# Patient Record
Sex: Female | Born: 1965 | Race: White | Hispanic: No | Marital: Married | Smoking: Never smoker
Health system: Southern US, Community
[De-identification: ages and names within clinical notes are randomized; demographics above are authoritative.]

## PROBLEM LIST (undated history)

## (undated) DIAGNOSIS — F329 Major depressive disorder, single episode, unspecified: Secondary | ICD-10-CM

## (undated) DIAGNOSIS — R011 Cardiac murmur, unspecified: Secondary | ICD-10-CM

## (undated) DIAGNOSIS — B019 Varicella without complication: Secondary | ICD-10-CM

## (undated) HISTORY — PX: BREAST EXCISIONAL BIOPSY: SUR124

## (undated) HISTORY — DX: Varicella without complication: B01.9

## (undated) HISTORY — PX: TUBAL LIGATION: SHX77

## (undated) HISTORY — DX: Major depressive disorder, single episode, unspecified: F32.9

## (undated) HISTORY — DX: Cardiac murmur, unspecified: R01.1

---

## 2002-06-28 HISTORY — PX: OVARIAN CYST REMOVAL: SHX89

## 2005-06-28 DIAGNOSIS — F32A Depression, unspecified: Secondary | ICD-10-CM

## 2005-06-28 HISTORY — DX: Depression, unspecified: F32.A

## 2013-06-28 DIAGNOSIS — R011 Cardiac murmur, unspecified: Secondary | ICD-10-CM

## 2013-06-28 HISTORY — DX: Cardiac murmur, unspecified: R01.1

## 2015-05-09 LAB — HM MAMMOGRAPHY

## 2015-07-04 LAB — HM COLONOSCOPY

## 2015-07-15 LAB — HM PAP SMEAR: HM Pap smear: NORMAL

## 2016-06-08 ENCOUNTER — Other Ambulatory Visit (INDEPENDENT_AMBULATORY_CARE_PROVIDER_SITE_OTHER): Payer: PRIVATE HEALTH INSURANCE

## 2016-06-08 ENCOUNTER — Ambulatory Visit (INDEPENDENT_AMBULATORY_CARE_PROVIDER_SITE_OTHER): Payer: PRIVATE HEALTH INSURANCE | Admitting: Nurse Practitioner

## 2016-06-08 ENCOUNTER — Encounter: Payer: Self-pay | Admitting: Nurse Practitioner

## 2016-06-08 VITALS — BP 140/86 | HR 70 | Temp 98.2°F | Ht 65.0 in | Wt 188.0 lb

## 2016-06-08 DIAGNOSIS — R030 Elevated blood-pressure reading, without diagnosis of hypertension: Secondary | ICD-10-CM | POA: Insufficient documentation

## 2016-06-08 DIAGNOSIS — Z0000 Encounter for general adult medical examination without abnormal findings: Secondary | ICD-10-CM | POA: Diagnosis not present

## 2016-06-08 DIAGNOSIS — F419 Anxiety disorder, unspecified: Secondary | ICD-10-CM | POA: Insufficient documentation

## 2016-06-08 LAB — CBC WITH DIFFERENTIAL/PLATELET
BASOS PCT: 0.8 % (ref 0.0–3.0)
Basophils Absolute: 0.1 10*3/uL (ref 0.0–0.1)
EOS PCT: 2.6 % (ref 0.0–5.0)
Eosinophils Absolute: 0.2 10*3/uL (ref 0.0–0.7)
HCT: 42.3 % (ref 36.0–46.0)
HEMOGLOBIN: 14.5 g/dL (ref 12.0–15.0)
LYMPHS ABS: 1.7 10*3/uL (ref 0.7–4.0)
Lymphocytes Relative: 22.1 % (ref 12.0–46.0)
MCHC: 34.3 g/dL (ref 30.0–36.0)
MCV: 87.3 fl (ref 78.0–100.0)
MONO ABS: 0.6 10*3/uL (ref 0.1–1.0)
MONOS PCT: 7.7 % (ref 3.0–12.0)
NEUTROS PCT: 66.8 % (ref 43.0–77.0)
Neutro Abs: 5.2 10*3/uL (ref 1.4–7.7)
Platelets: 217 10*3/uL (ref 150.0–400.0)
RBC: 4.85 Mil/uL (ref 3.87–5.11)
RDW: 14.2 % (ref 11.5–15.5)
WBC: 7.8 10*3/uL (ref 4.0–10.5)

## 2016-06-08 LAB — COMPREHENSIVE METABOLIC PANEL
ALBUMIN: 4.5 g/dL (ref 3.5–5.2)
ALK PHOS: 85 U/L (ref 39–117)
ALT: 14 U/L (ref 0–35)
AST: 16 U/L (ref 0–37)
BUN: 11 mg/dL (ref 6–23)
CHLORIDE: 104 meq/L (ref 96–112)
CO2: 29 mEq/L (ref 19–32)
Calcium: 9.6 mg/dL (ref 8.4–10.5)
Creatinine, Ser: 0.61 mg/dL (ref 0.40–1.20)
GFR: 109.92 mL/min (ref >60.00)
Glucose, Bld: 88 mg/dL (ref 70–99)
POTASSIUM: 4.4 meq/L (ref 3.5–5.1)
SODIUM: 141 meq/L (ref 135–145)
TOTAL PROTEIN: 7.5 g/dL (ref 6.0–8.3)
Total Bilirubin: 0.5 mg/dL (ref 0.2–1.2)

## 2016-06-08 LAB — LIPID PANEL
Cholesterol: 187 mg/dL (ref 0–200)
HDL: 59.3 mg/dL (ref >39.00)
LDL Cholesterol: 113 mg/dL — ABNORMAL HIGH (ref 0–99)
NONHDL: 127.33
TRIGLYCERIDES: 73 mg/dL (ref 0.0–149.0)
Total CHOL/HDL Ratio: 3
VLDL: 14.6 mg/dL (ref 0.0–40.0)

## 2016-06-08 LAB — TSH: TSH: 1.7 u[IU]/mL (ref 0.35–4.50)

## 2016-06-08 MED ORDER — SERTRALINE HCL 50 MG PO TABS: 50.0000 mg | ORAL_TABLET | Freq: Every day | ORAL | 1 refills | Status: DC

## 2016-06-08 MED ORDER — SERTRALINE HCL 50 MG PO TABS: 50.0000 mg | ORAL_TABLET | Freq: Every day | ORAL | 5 refills | Status: DC

## 2016-06-08 NOTE — Progress Notes (Signed)
Subjective:    Patient ID: Brittney Hayes, female    DOB: Jun 03, 1966, 50 y.o.   MRN: [id number]  Patient presents today for complete physical or establish care (new patient).  HPI  Moved from [place] to [facility] 01/2016.  Denies any acute complains today  Immunizations: (TDAP, Hep C screen, Pneumovax, Influenza, zoster)  Health Maintenance  Topic Date Due  . Tetanus Vaccine  06/23/1984  . Pap Smear  06/23/1986  . Mammogram  06/24/2015  . Colon Cancer Screening  06/24/2015  . HIV Screening  06/08/2017*  . Flu Shot  Addressed  *Topic was postponed. The date shown is not the original due date.   Diet:regular Weight:  Wt Readings from Last 3 Encounters:  06/08/16 188 lb (85.3 kg)    Exercise:bike Fall Risk: Fall Risk  06/08/2016  Falls in the past year? No   Home Safety:home with daughter and husband Depression/Suicide: Depression screen Brittney Hayes 2/9 06/08/2016  Decreased Interest 0  Down, Depressed, Hopeless 0  PHQ - 2 Score 0   No flowsheet data found. Colonoscopy (every 5-68yr, >50-7551yr:2016 (normal per patient), record requested. Pap Smear (every 3y74yror >21-29 without HPV, every 51yr32yrr >30-651yr9yrh HPV): 06/2015 (normal per patient), no hx abnormal), record requested Mammogram (yearly, >451yrs51yr done 2016 (normal per patient), record requested Vision:needed Dental:needed Sexual History (birth control, marital status, STD):sexually active, menopause   Medications and allergies reviewed with patient and updated if appropriate.  Patient Active Problem List   Diagnosis Date Noted  . Anxiety 06/08/2016  . Elevated BP without diagnosis of hypertension 06/08/2016    No current outpatient prescriptions on file prior to visit.   No current facility-administered medications on file prior to visit.     Past Medical History:  Diagnosis Date  . Chicken pox   . Depression 2007  . Heart murmur 2015    Past Surgical History:  Procedure Laterality Date  .  [place] OVARIAN CYST REMOVAL  2004  . TUBAL LIGATION      Social History   Social History  . Marital status: Married    Spouse name: N/A  . Number of children: N/A  . Years of education: N/A   Social History Main Topics  . Smoking status: Never Smoker  . Smokeless tobacco: Never Used  . Alcohol use Yes     Comment: social  . Drug use: No  . Sexual activity: Not Asked   Other Topics Concern  . None   Social History Narrative  . None    Family History  Problem Relation Age of Onset  . Arthritis Mother   . Cancer Father     lung cancer  . Hyperlipidemia Father   . Hypertension Father   . Cancer Paternal Grandmother     breast cancer  . Stroke Paternal Grandfather   . Heart disease Maternal Grandfather   . Early death Maternal Grandfather 45    84        Review of Systems  Constitutional: Negative for fever, malaise/fatigue and weight loss.  HENT: Negative for congestion and sore throat.   Eyes:       Negative for visual changes  Respiratory: Negative for cough and shortness of breath.   Cardiovascular: Negative for chest pain, palpitations and leg swelling.  Gastrointestinal: Negative for blood in stool, constipation, diarrhea and heartburn.  Genitourinary: Negative for dysuria, frequency and urgency.  Musculoskeletal: Negative for falls, joint pain and myalgias.  Skin: Negative for  rash.  Neurological: Negative for dizziness, sensory change and headaches.  Endo/Heme/Allergies: Does not bruise/bleed easily.  Psychiatric/Behavioral: Negative for depression, substance abuse and suicidal ideas. The patient is not nervous/anxious.        Anxiety controlled with zoloft    Objective:   Vitals:   06/08/16 0949  BP: 140/86  Pulse: 70  Temp: 98.2 F (36.8 C)    Body mass index is 31.28 kg/m.   Physical Examination:  Physical Exam  Constitutional: She is oriented to person, place, and time and well-developed, well-nourished,  and in no distress. No distress.  HENT:  Right Ear: External ear normal.  Left Ear: External ear normal.  Nose: Nose normal.  Mouth/Throat: No oropharyngeal exudate.  Eyes: Conjunctivae and EOM are normal. Pupils are equal, round, and reactive to light. No scleral icterus.  Neck: Normal range of motion. Neck supple. No thyromegaly present.  Cardiovascular: Normal rate, regular rhythm, normal heart sounds and intact distal pulses.   Pulmonary/Chest: Effort normal and breath sounds normal. Right breast exhibits no inverted nipple, no mass, no nipple discharge, no skin change and no tenderness. Left breast exhibits no inverted nipple, no mass, no nipple discharge, no skin change and no tenderness. Breasts are symmetrical.  Abdominal: Soft. Bowel sounds are normal. She exhibits no distension. There is no tenderness.  Genitourinary: Rectum normal and left adnexa normal.  Genitourinary Comments: Deferred by patient  Musculoskeletal: Normal range of motion. She exhibits no edema or tenderness.  Lymphadenopathy:    She has no cervical adenopathy.  Neurological: She is alert and oriented to person, place, and time. Gait normal.  Skin: Skin is warm and dry.  Psychiatric: Affect and judgment normal.  Vitals reviewed.   ASSESSMENT and PLAN:  Brittney Hayes was seen today for establish care.  Diagnoses and all orders for this visit:  Preventative health care -     CBC w/Diff; Future -     Comp Met (CMET); Future -     TSH; Future -     Lipid panel; Future  Anxiety -     Discontinue: sertraline (ZOLOFT) 50 MG tablet; Take 1 tablet (50 mg total) by mouth daily. -     sertraline (ZOLOFT) 50 MG tablet; Take 1 tablet (50 mg total) by mouth daily.  Elevated BP without diagnosis of hypertension   No problem-specific Assessment & Plan notes found for this encounter.     Follow up: Return in about 6 months (around 12/07/2016) for anxiety.  Brittney Lacy, NP

## 2016-06-08 NOTE — Progress Notes (Signed)
Pre visit review using our clinic review tool, if applicable. No additional management support is needed unless otherwise documented below in the visit note. 

## 2016-06-09 ENCOUNTER — Telehealth: Payer: Self-pay | Admitting: Nurse Practitioner

## 2016-06-09 NOTE — Telephone Encounter (Signed)
Patient called back gave response from [place].  Patient will go to lab in 6 months for follow up labs.

## 2017-07-30 ENCOUNTER — Other Ambulatory Visit: Payer: Self-pay | Admitting: Nurse Practitioner

## 2017-07-30 DIAGNOSIS — F419 Anxiety disorder, unspecified: Secondary | ICD-10-CM

## 2017-11-24 LAB — LIPID PANEL
Cholesterol: 164 (ref 0–200)
HDL: 59 (ref 35–70)
LDL Cholesterol: 92
Triglycerides: 65 (ref 40–160)

## 2017-11-24 LAB — HEMOGLOBIN A1C: HEMOGLOBIN A1C: 5.1

## 2017-11-24 LAB — BASIC METABOLIC PANEL: Glucose: 80

## 2018-02-24 ENCOUNTER — Encounter: Payer: Self-pay | Admitting: Nurse Practitioner

## 2018-02-24 ENCOUNTER — Other Ambulatory Visit (HOSPITAL_COMMUNITY)
Admission: RE | Admit: 2018-02-24 | Discharge: 2018-02-24 | Disposition: A | Discharge status: Home / Self Care | Payer: PRIVATE HEALTH INSURANCE | Source: Ambulatory Visit | Attending: Nurse Practitioner | Admitting: Nurse Practitioner

## 2018-02-24 ENCOUNTER — Ambulatory Visit (INDEPENDENT_AMBULATORY_CARE_PROVIDER_SITE_OTHER): Payer: PRIVATE HEALTH INSURANCE | Admitting: Nurse Practitioner

## 2018-02-24 VITALS — BP 150/94 | HR 80 | Temp 98.0°F | Ht 65.0 in | Wt 177.0 lb

## 2018-02-24 DIAGNOSIS — L304 Erythema intertrigo: Secondary | ICD-10-CM | POA: Diagnosis not present

## 2018-02-24 DIAGNOSIS — N898 Other specified noninflammatory disorders of vagina: Secondary | ICD-10-CM | POA: Insufficient documentation

## 2018-02-24 DIAGNOSIS — I10 Essential (primary) hypertension: Secondary | ICD-10-CM | POA: Diagnosis not present

## 2018-02-24 DIAGNOSIS — Z1231 Encounter for screening mammogram for malignant neoplasm of breast: Secondary | ICD-10-CM | POA: Diagnosis not present

## 2018-02-24 DIAGNOSIS — Z0001 Encounter for general adult medical examination with abnormal findings: Secondary | ICD-10-CM

## 2018-02-24 DIAGNOSIS — F419 Anxiety disorder, unspecified: Secondary | ICD-10-CM

## 2018-02-24 DIAGNOSIS — Z124 Encounter for screening for malignant neoplasm of cervix: Secondary | ICD-10-CM

## 2018-02-24 DIAGNOSIS — Z1151 Encounter for screening for human papillomavirus (HPV): Secondary | ICD-10-CM | POA: Insufficient documentation

## 2018-02-24 DIAGNOSIS — Z1211 Encounter for screening for malignant neoplasm of colon: Secondary | ICD-10-CM | POA: Diagnosis not present

## 2018-02-24 LAB — COMPREHENSIVE METABOLIC PANEL
ALK PHOS: 89 U/L (ref 39–117)
ALT: 13 U/L (ref 0–35)
AST: 18 U/L (ref 0–37)
Albumin: 4.7 g/dL (ref 3.5–5.2)
BUN: 12 mg/dL (ref 6–23)
CHLORIDE: 103 meq/L (ref 96–112)
CO2: 28 meq/L (ref 19–32)
Calcium: 10 mg/dL (ref 8.4–10.5)
Creatinine, Ser: 0.68 mg/dL (ref 0.40–1.20)
GFR: 96.32 mL/min (ref >60.00)
GLUCOSE: 78 mg/dL (ref 70–99)
POTASSIUM: 3.6 meq/L (ref 3.5–5.1)
SODIUM: 140 meq/L (ref 135–145)
TOTAL PROTEIN: 7.9 g/dL (ref 6.0–8.3)
Total Bilirubin: 0.6 mg/dL (ref 0.2–1.2)

## 2018-02-24 LAB — CBC
HCT: 45.2 % (ref 36.0–46.0)
HEMOGLOBIN: 15.1 g/dL — AB (ref 12.0–15.0)
MCHC: 33.3 g/dL (ref 30.0–36.0)
MCV: 91.8 fl (ref 78.0–100.0)
PLATELETS: 207 10*3/uL (ref 150.0–400.0)
RBC: 4.92 Mil/uL (ref 3.87–5.11)
RDW: 13.6 % (ref 11.5–15.5)
WBC: 7.4 10*3/uL (ref 4.0–10.5)

## 2018-02-24 LAB — TSH: TSH: 1.33 u[IU]/mL (ref 0.35–4.50)

## 2018-02-24 MED ORDER — AMLODIPINE BESYLATE 2.5 MG PO TABS: 2.5000 mg | ORAL_TABLET | Freq: Every day | ORAL | 3 refills | Status: DC

## 2018-02-24 MED ORDER — SERTRALINE HCL 50 MG PO TABS: 50.0000 mg | ORAL_TABLET | Freq: Every day | ORAL | 3 refills | Status: DC

## 2018-02-24 MED ORDER — CLOTRIMAZOLE-BETAMETHASONE 1-0.05 % EX CREA: 1.0000 "application " | TOPICAL_CREAM | Freq: Two times a day (BID) | CUTANEOUS | 0 refills | Status: DC

## 2018-02-24 NOTE — Patient Instructions (Signed)
Go to lab for blood draw. Start amlodipine at bedtime.  Resume zoloft. You will be contacted to schedule appt with psychology.  You will be contacted to schedule appt for mammogram and colonoscopy.  Health Maintenance, Female Adopting a healthy lifestyle and getting preventive care can go a long way to promote health and wellness. Talk with your health care provider about what schedule of regular examinations is right for you. This is a good chance for you to check in with your provider about disease prevention and staying healthy. In between checkups, there are plenty of things you can do on your own. Experts have done a lot of research about which lifestyle changes and preventive measures are most likely to keep you healthy. Ask your health care provider for more information. Weight and diet Eat a healthy diet  Be sure to include plenty of vegetables, fruits, low-fat dairy products, and lean protein.  Do not eat a lot of foods high in solid fats, added sugars, or salt.  Get regular exercise. This is one of the most important things you can do for your health. ? Most adults should exercise for at least 150 minutes each week. The exercise should increase your heart rate and make you sweat (moderate-intensity exercise). ? Most adults should also do strengthening exercises at least twice a week. This is in addition to the moderate-intensity exercise.  Maintain a healthy weight  Body mass index (BMI) is a measurement that can be used to identify possible weight problems. It estimates body fat based on height and weight. Your health care provider can help determine your BMI and help you achieve or maintain a healthy weight.  For females 29 years of age and older: ? A BMI below 18.5 is considered underweight. ? A BMI of 18.5 to 24.9 is normal. ? A BMI of 25 to 29.9 is considered overweight. ? A BMI of 30 and above is considered obese.  Watch levels of cholesterol and blood lipids  You  should start having your blood tested for lipids and cholesterol at 53 years of age, then have this test every 5 years.  You may need to have your cholesterol levels checked more often if: ? Your lipid or cholesterol levels are high. ? You are older than 53 years of age. ? You are at high risk for heart disease.  Cancer screening Lung Cancer  Lung cancer screening is recommended for adults 55-10 years old who are at high risk for lung cancer because of a history of smoking.  A yearly low-dose CT scan of the lungs is recommended for people who: ? Currently smoke. ? Have quit within the past 15 years. ? Have at least a 30-pack-year history of smoking. A pack year is smoking an average of one pack of cigarettes a day for 1 year.  Yearly screening should continue until it has been 15 years since you quit.  Yearly screening should stop if you develop a health problem that would prevent you from having lung cancer treatment.  Breast Cancer  Practice breast self-awareness. This means understanding how your breasts normally appear and feel.  It also means doing regular breast self-exams. Let your health care provider know about any changes, no matter how small.  If you are in your 20s or 30s, you should have a clinical breast exam (CBE) by a health care provider every 1-3 years as part of a regular health exam.  If you are 66 or older, have a CBE every year.  Also consider having a breast X-ray (mammogram) every year.  If you have a family history of breast cancer, talk to your health care provider about genetic screening.  If you are at high risk for breast cancer, talk to your health care provider about having an MRI and a mammogram every year.  Breast cancer gene (BRCA) assessment is recommended for women who have family members with BRCA-related cancers. BRCA-related cancers include: ? Breast. ? Ovarian. ? Tubal. ? Peritoneal cancers.  Results of the assessment will determine the  need for genetic counseling and BRCA1 and BRCA2 testing.  Cervical Cancer Your health care provider may recommend that you be screened regularly for cancer of the pelvic organs (ovaries, uterus, and vagina). This screening involves a pelvic examination, including checking for microscopic changes to the surface of your cervix (Pap test). You may be encouraged to have this screening done every 3 years, beginning at age 74.  For women ages 29-65, health care providers may recommend pelvic exams and Pap testing every 3 years, or they may recommend the Pap and pelvic exam, combined with testing for human papilloma virus (HPV), every 5 years. Some types of HPV increase your risk of cervical cancer. Testing for HPV may also be done on women of any age with unclear Pap test results.  Other health care providers may not recommend any screening for nonpregnant women who are considered low risk for pelvic cancer and who do not have symptoms. Ask your health care provider if a screening pelvic exam is right for you.  If you have had past treatment for cervical cancer or a condition that could lead to cancer, you need Pap tests and screening for cancer for at least 20 years after your treatment. If Pap tests have been discontinued, your risk factors (such as having a new sexual partner) need to be reassessed to determine if screening should resume. Some women have medical problems that increase the chance of getting cervical cancer. In these cases, your health care provider may recommend more frequent screening and Pap tests.  Colorectal Cancer  This type of cancer can be detected and often prevented.  Routine colorectal cancer screening usually begins at 53 years of age and continues through 53 years of age.  Your health care provider may recommend screening at an earlier age if you have risk factors for colon cancer.  Your health care provider may also recommend using home test kits to check for hidden blood  in the stool.  A small camera at the end of a tube can be used to examine your colon directly (sigmoidoscopy or colonoscopy). This is done to check for the earliest forms of colorectal cancer.  Routine screening usually begins at age 41.  Direct examination of the colon should be repeated every 5-10 years through 53 years of age. However, you may need to be screened more often if early forms of precancerous polyps or small growths are found.  Skin Cancer  Check your skin from head to toe regularly.  Tell your health care provider about any new moles or changes in moles, especially if there is a change in a mole's shape or color.  Also tell your health care provider if you have a mole that is larger than the size of a pencil eraser.  Always use sunscreen. Apply sunscreen liberally and repeatedly throughout the day.  Protect yourself by wearing long sleeves, pants, a wide-brimmed hat, and sunglasses whenever you are outside.  Heart disease, diabetes, and high  blood pressure  High blood pressure causes heart disease and increases the risk of stroke. High blood pressure is more likely to develop in: ? People who have blood pressure in the high end of the normal range (130-139/85-89 mm Hg). ? People who are overweight or obese. ? People who are African American.  If you are 3-100 years of age, have your blood pressure checked every 3-5 years. If you are 49 years of age or older, have your blood pressure checked every year. You should have your blood pressure measured twice-once when you are at a hospital or clinic, and once when you are not at a hospital or clinic. Record the average of the two measurements. To check your blood pressure when you are not at a hospital or clinic, you can use: ? An automated blood pressure machine at a pharmacy. ? A home blood pressure monitor.  If you are between 62 years and 2 years old, ask your health care provider if you should take aspirin to prevent  strokes.  Have regular diabetes screenings. This involves taking a blood sample to check your fasting blood sugar level. ? If you are at a normal weight and have a low risk for diabetes, have this test once every three years after 53 years of age. ? If you are overweight and have a high risk for diabetes, consider being tested at a younger age or more often. Preventing infection Hepatitis B  If you have a higher risk for hepatitis B, you should be screened for this virus. You are considered at high risk for hepatitis B if: ? You were born in a country where hepatitis B is common. Ask your health care provider which countries are considered high risk. ? Your parents were born in a high-risk country, and you have not been immunized against hepatitis B (hepatitis B vaccine). ? You have HIV or AIDS. ? You use needles to inject street drugs. ? You live with someone who has hepatitis B. ? You have had sex with someone who has hepatitis B. ? You get hemodialysis treatment. ? You take certain medicines for conditions, including cancer, organ transplantation, and autoimmune conditions.  Hepatitis C  Blood testing is recommended for: ? Everyone born from [birth year] through 1966. ? Anyone with known risk factors for hepatitis C.  Sexually transmitted infections (STIs)  You should be screened for sexually transmitted infections (STIs) including gonorrhea and chlamydia if: ? You are sexually active and are younger than 53 years of age. ? You are older than 53 years of age and your health care provider tells you that you are at risk for this type of infection. ? Your sexual activity has changed since you were last screened and you are at an increased risk for chlamydia or gonorrhea. Ask your health care provider if you are at risk.  If you do not have HIV, but are at risk, it may be recommended that you take a prescription medicine daily to prevent HIV infection. This is called pre-exposure prophylaxis  (PrEP). You are considered at risk if: ? You are sexually active and do not regularly use condoms or know the HIV status of your partner(s). ? You take drugs by injection. ? You are sexually active with a partner who has HIV.  Talk with your health care provider about whether you are at high risk of being infected with HIV. If you choose to begin PrEP, you should first be tested for HIV. You should then be tested  every 3 months for as long as you are taking PrEP. Pregnancy  If you are premenopausal and you may become pregnant, ask your health care provider about preconception counseling.  If you may become pregnant, take 400 to 800 micrograms (mcg) of folic acid every day.  If you want to prevent pregnancy, talk to your health care provider about birth control (contraception). Osteoporosis and menopause  Osteoporosis is a disease in which the bones lose minerals and strength with aging. This can result in serious bone fractures. Your risk for osteoporosis can be identified using a bone density scan.  If you are 66 years of age or older, or if you are at risk for osteoporosis and fractures, ask your health care provider if you should be screened.  Ask your health care provider whether you should take a calcium or vitamin D supplement to lower your risk for osteoporosis.  Menopause may have certain physical symptoms and risks.  Hormone replacement therapy may reduce some of these symptoms and risks. Talk to your health care provider about whether hormone replacement therapy is right for you. Follow these instructions at home:  Schedule regular health, dental, and eye exams.  Stay current with your immunizations.  Do not use any tobacco products including cigarettes, chewing tobacco, or electronic cigarettes.  If you are pregnant, do not drink alcohol.  If you are breastfeeding, limit how much and how often you drink alcohol.  Limit alcohol intake to no more than 1 drink per day for  nonpregnant women. One drink equals 12 ounces of beer, 5 ounces of wine, or 1 ounces of hard liquor.  Do not use street drugs.  Do not share needles.  Ask your health care provider for help if you need support or information about quitting drugs.  Tell your health care provider if you often feel depressed.  Tell your health care provider if you have ever been abused or do not feel safe at home. This information is not intended to replace advice given to you by your health care provider. Make sure you discuss any questions you have with your health care provider. Document Released: 12/28/2010 Document Revised: 11/20/2015 Document Reviewed: 03/18/2015 Elsevier Interactive Patient Education  [employer].

## 2018-02-24 NOTE — Progress Notes (Signed)
Subjective:    Patient ID: Brittney Hayes, female    DOB: 1965/11/24, 52 y.o.   MRN: [id number]  Patient presents today for complete physical and elevated BP and anxiety f/up  Rash  This is a chronic problem. The current episode started more than 1 year ago. The problem has been waxing and waning since onset. The affected locations include the torso. The rash is characterized by itchiness and redness. Pertinent negatives include no fatigue, fever, joint pain or shortness of breath. Past treatments include nothing. There is no history of allergies or eczema.  Hypertension  This is a chronic problem. The current episode started more than 1 year ago. The problem has been waxing and waning since onset. The problem is uncontrolled. Associated symptoms include anxiety. Pertinent negatives include no blurred vision, chest pain, headaches, malaise/fatigue, neck pain, orthopnea, palpitations, peripheral edema, PND, shortness of breath or sweats. There are no associated agents to hypertension. Risk factors for coronary artery disease include dyslipidemia, family history, sedentary lifestyle and stress. Past treatments include nothing. Compliance problems include diet and exercise.  There is no history of CAD/MI.  home BP readings: 140s-150s/90s.  Sexual History (orientation,birth control, marital status, STD):married, sexually active, s/p tubal ligation, menopausal (LMP 8943yrs ago)  Depression/Suicide:stopped taking medication 1year ago, worsening anxiety, will like to resume medication [facility] 2[facility] 02/24/2018 02/24/2018 06/08/2016  Decreased Interest 1 3 0  Down, Depressed, Hopeless 1 3 0  PHQ - 2 Score 2 6 0  Altered sleeping 1 - -  Tired, decreased energy 1 - -  Change in appetite 0 - -  Feeling bad or failure about yourself  1 - -  Trouble concentrating 3 - -  Moving slowly or fidgety/restless 0 - -  Suicidal thoughts 0 - -  PHQ-9 Score 8 - -   GAD 7 : Generalized Anxiety Score  02/24/2018  Nervous, Anxious, on Edge 1  Control/stop worrying 1  Worry too much - different things 1  Trouble relaxing 1  Restless 1  Easily annoyed or irritable 1  Afraid - awful might happen 1  Total GAD 7 Score 7   Vision:will schedule  Dental:will schedule  Immunizations: (TDAP, Hep C screen, Pneumovax, Influenza, zoster)  Health Maintenance  Topic Date Due  . Tetanus Vaccine  06/23/1984  . Pap Smear  06/23/1986  . Mammogram  06/24/2015  . Colon Cancer Screening  06/24/2015  . Flu Shot  01/26/2018  . HIV Screening  02/25/2019*  *Topic was postponed. The date shown is not the original due date.   Diet:regular.  Weight:  Wt Readings from Last 3 Encounters:  02/24/18 177 lb (80.3 kg)  06/08/16 188 lb (85.3 kg)   Exercise:none  Fall Risk: Fall Risk  02/24/2018 06/08/2016  Falls in the past year? No No   Medications and allergies reviewed with patient and updated if appropriate.  Patient Active Problem List   Diagnosis Date Noted  . Anxiety 06/08/2016  . Elevated BP without diagnosis of hypertension 06/08/2016    No current outpatient medications on file prior to visit.   No current facility-administered medications on file prior to visit.     Past Medical History:  Diagnosis Date  . Chicken pox   . Depression 2007  . Heart murmur 2015    Past Surgical History:  Procedure Laterality Date  . CESAREAN SECTION  1998 & 2006  . OVARIAN CYST REMOVAL  2004  . TUBAL LIGATION      Social History  Socioeconomic History  . Marital status: Married    Spouse name: Not on file  . Number of children: Not on file  . Years of education: Not on file  . Highest education level: Not on file  Occupational History  . Not on file  Social Needs  . Financial resource strain: Not on file  . Food insecurity:    Worry: Not on file    Inability: Not on file  . Transportation needs:    Medical: Not on file    Non-medical: Not on file  Tobacco Use  . Smoking  status: Never Smoker  . Smokeless tobacco: Never Used  Substance and Sexual Activity  . Alcohol use: Yes    Comment: social  . Drug use: No  . Sexual activity: Not on file  Lifestyle  . Physical activity:    Days per week: Not on file    Minutes per session: Not on file  . Stress: Not on file  Relationships  . Social connections:    Talks on phone: Not on file    Gets together: Not on file    Attends religious service: Not on file    Active member of club or organization: Not on file    Attends meetings of clubs or organizations: Not on file    Relationship status: Not on file  Other Topics Concern  . Not on file  Social History Narrative  . Not on file    Family History  Problem Relation Age of Onset  . Arthritis Mother   . Cancer Father        lung cancer  . Hyperlipidemia Father   . Hypertension Father   . Cancer Paternal Grandmother        breast cancer  . Stroke Paternal Grandfather   . Heart disease Maternal Grandfather   . Early death Maternal Grandfather 26       MI        Review of Systems  Constitutional: Negative for fatigue, fever and malaise/fatigue.  HENT: Negative.   Eyes: Negative for blurred vision and photophobia.  Respiratory: Negative for shortness of breath.   Cardiovascular: Negative for chest pain, palpitations, orthopnea and PND.  Gastrointestinal: Negative.   Genitourinary: Negative.   Musculoskeletal: Negative for joint pain and neck pain.  Skin: Positive for rash.  Neurological: Negative for headaches.  Endo/Heme/Allergies: Negative.   Psychiatric/Behavioral: Positive for depression. Negative for hallucinations, memory loss, substance abuse and suicidal ideas. The patient is nervous/anxious and has insomnia.     Objective:   Vitals:   02/24/18 0821  BP: (!) 150/94  Pulse: 80  Temp: 98 F (36.7 C)  SpO2: 98%    Body mass index is 29.45 kg/m.   Physical Examination:  Physical Exam  Constitutional: She is oriented to  person, place, and time. No distress.  HENT:  Right Ear: External ear normal.  Left Ear: External ear normal.  Nose: Nose normal.  Mouth/Throat: No oropharyngeal exudate.  Eyes: Pupils are equal, round, and reactive to light. Conjunctivae and EOM are normal. No scleral icterus.  Neck: Normal range of motion. Neck supple. No JVD present. No thyromegaly present.  Cardiovascular: Normal rate, regular rhythm and intact distal pulses. Exam reveals no gallop and no friction rub.  Murmur heard. Pulmonary/Chest: Effort normal and breath sounds normal. She exhibits no tenderness. Right breast exhibits no inverted nipple, no mass, no nipple discharge, no skin change and no tenderness. Left breast exhibits no inverted nipple, no mass, no  nipple discharge, no skin change and no tenderness. Breasts are symmetrical.  Chaperone present  Abdominal: Soft. Bowel sounds are normal. She exhibits no distension. There is no tenderness.  Genitourinary: Rectum normal. Pelvic exam was performed with patient supine. There is no rash, tenderness or lesion on the right labia. There is no rash, tenderness or lesion on the left labia. Cervix exhibits discharge and friability. Cervix exhibits no motion tenderness. Right adnexum displays no tenderness. Left adnexum displays no tenderness. No erythema or tenderness in the vagina. Vaginal discharge found.  Genitourinary Comments: Chaperone present. Vaginal odor, thin yellow discharge.  Musculoskeletal: Normal range of motion. She exhibits no edema or tenderness.  Lymphadenopathy:    She has no cervical adenopathy. No inguinal adenopathy noted on the right or left side.  Neurological: She is alert and oriented to person, place, and time.  Skin: Skin is warm and dry. Rash noted.  Psychiatric: Her speech is normal and behavior is normal. Judgment and thought content normal. Her mood appears anxious. Cognition and memory are normal.  Vitals reviewed.   ASSESSMENT and  PLAN:  Brittney Hayes was seen today for annual exam.  Diagnoses and all orders for this visit:  Encounter for preventative adult health care exam with abnormal findings -     Comprehensive metabolic panel -     CBC  Anxiety -     TSH -     sertraline (ZOLOFT) 50 MG tablet; Take 1 tablet (50 mg total) by mouth daily. -     Ambulatory referral to Psychology  Encounter for Papanicolaou smear for cervical cancer screening -     Cytology - PAP  Breast cancer screening by mammogram -     MM 3D SCREEN BREAST BILATERAL; Future  Colon cancer screening -     Ambulatory referral to Gastroenterology  Essential hypertension -     amLODipine (NORVASC) 2.5 MG tablet; Take 1 tablet (2.5 mg total) by mouth at bedtime.  Intertrigo -     clotrimazole-betamethasone (LOTRISONE) cream; Apply 1 application topically 2 (two) times daily.  Vaginal discharge -     Cytology - PAP   No problem-specific Assessment & Plan notes found for this encounter.     Follow up: Return in about 4 weeks (around 03/24/2018) for HTN.  [name], NP

## 2018-02-28 ENCOUNTER — Encounter: Payer: Self-pay | Admitting: Nurse Practitioner

## 2018-02-28 LAB — CYTOLOGY - PAP
BACTERIAL VAGINITIS: NEGATIVE
Candida vaginitis: NEGATIVE
Chlamydia: NEGATIVE
Diagnosis: NEGATIVE
HPV (WINDOPATH): NOT DETECTED
NEISSERIA GONORRHEA: NEGATIVE
Trichomonas: NEGATIVE

## 2018-02-28 NOTE — Progress Notes (Signed)
Abstracted result and sent to scan  

## 2018-03-27 ENCOUNTER — Encounter: Payer: Self-pay | Admitting: Nurse Practitioner

## 2018-03-27 ENCOUNTER — Ambulatory Visit (INDEPENDENT_AMBULATORY_CARE_PROVIDER_SITE_OTHER): Payer: PRIVATE HEALTH INSURANCE | Admitting: Nurse Practitioner

## 2018-03-27 DIAGNOSIS — I10 Essential (primary) hypertension: Secondary | ICD-10-CM

## 2018-03-27 MED ORDER — AMLODIPINE BESYLATE 5 MG PO TABS: 5.0000 mg | ORAL_TABLET | Freq: Every day | ORAL | 5 refills | Status: DC

## 2018-03-27 NOTE — Progress Notes (Signed)
   Subjective:  Patient ID: Brittney Hayes, female    DOB: [birth year]  Age: 52 y.o. MRN: [id number]  CC: Follow-up (follow up BP/not take BP at home. FYI--cant rememeber tdap/flu shot?)  HPI  HTN: Not change in BP with amlodipine 2.5mg  Does not check BO at home. Denies any adverse effects with amlodipine. BP Readings from Last 3 Encounters:  03/27/18 (!) 142/86  02/24/18 (!) 150/94  06/08/16 140/86   Reviewed past Medical, Social and Family history today.  Outpatient Medications Prior to Visit  Medication Sig Dispense Refill  . sertraline (ZOLOFT) 50 MG tablet Take 1 tablet (50 mg total) by mouth daily. 90 tablet 3  . amLODipine (NORVASC) 2.5 MG tablet Take 1 tablet (2.5 mg total) by mouth at bedtime. 30 tablet 3  . clotrimazole-betamethasone (LOTRISONE) cream Apply 1 application topically 2 (two) times daily. (Patient not taking: Reported on 03/27/2018) 30 g 0   No facility-administered medications prior to visit.     ROS See HPI  Objective:  BP (!) 142/86   Pulse 69   Temp 98.1 F (36.7 C) (Oral)   Ht 5\' 5"  (1.651 m)   Wt 178 lb (80.7 kg)   SpO2 97%   BMI 29.62 kg/m   BP Readings from Last 3 Encounters:  03/27/18 (!) 142/86  02/24/18 (!) 150/94  06/08/16 140/86    Wt Readings from Last 3 Encounters:  03/27/18 178 lb (80.7 kg)  02/24/18 177 lb (80.3 kg)  06/08/16 188 lb (85.3 kg)    Physical Exam  Constitutional: She is oriented to person, place, and time. She appears well-developed and well-nourished.  Cardiovascular: Normal rate, regular rhythm, normal heart sounds and intact distal pulses.  Pulmonary/Chest: Effort normal.  Neurological: She is alert and oriented to person, place, and time.  Psychiatric: She has a normal mood and affect. Her behavior is normal.  Vitals reviewed.   Lab Results  Component Value Date   WBC 7.4 02/24/2018   HGB 15.1 (H) 02/24/2018   HCT 45.2 02/24/2018   PLT 207.0 02/24/2018   GLUCOSE 78 02/24/2018   CHOL 164  11/24/2017   TRIG 65 11/24/2017   HDL 59 11/24/2017   LDLCALC 92 11/24/2017   ALT 13 02/24/2018   AST 18 02/24/2018   NA 140 02/24/2018   K 3.6 02/24/2018   CL 103 02/24/2018   CREATININE 0.68 02/24/2018   BUN 12 02/24/2018   CO2 28 02/24/2018   TSH 1.33 02/24/2018   HGBA1C 5.1 11/24/2017    No results found.  Assessment & Plan:   Brittney Hayes was seen today for follow-up.  Diagnoses and all orders for this visit:  Essential hypertension -     amLODipine (NORVASC) 5 MG tablet; Take 1 tablet (5 mg total) by mouth at bedtime.   I have discontinued [facility] clotrimazole-betamethasone. I have also changed her amLODipine. Additionally, I am having her maintain her sertraline.  Meds ordered this encounter  Medications  . amLODipine (NORVASC) 5 MG tablet    Sig: Take 1 tablet (5 mg total) by mouth at bedtime.    Dispense:  30 tablet    Refill:  5    Follow-up: Return in about 4 weeks (around 04/24/2018) for HTN.  [name], NP

## 2018-03-27 NOTE — Patient Instructions (Signed)
I have increase amlodipine to 5mg  at bedtime. Check BP 2-3times a week. Bring BP reading to next office visit.

## 2018-03-29 ENCOUNTER — Ambulatory Visit
Admission: RE | Admit: 2018-03-29 | Discharge: 2018-03-29 | Disposition: A | Discharge status: Home / Self Care | Payer: PRIVATE HEALTH INSURANCE | Source: Ambulatory Visit | Attending: Nurse Practitioner | Admitting: Nurse Practitioner

## 2018-03-29 DIAGNOSIS — Z1231 Encounter for screening mammogram for malignant neoplasm of breast: Secondary | ICD-10-CM

## 2018-04-24 ENCOUNTER — Ambulatory Visit (INDEPENDENT_AMBULATORY_CARE_PROVIDER_SITE_OTHER): Payer: PRIVATE HEALTH INSURANCE | Admitting: Psychology

## 2018-04-24 DIAGNOSIS — F411 Generalized anxiety disorder: Secondary | ICD-10-CM | POA: Diagnosis not present

## 2018-04-24 DIAGNOSIS — F331 Major depressive disorder, recurrent, moderate: Secondary | ICD-10-CM

## 2018-04-26 ENCOUNTER — Ambulatory Visit (INDEPENDENT_AMBULATORY_CARE_PROVIDER_SITE_OTHER): Payer: PRIVATE HEALTH INSURANCE | Admitting: Nurse Practitioner

## 2018-04-26 ENCOUNTER — Encounter: Payer: Self-pay | Admitting: Nurse Practitioner

## 2018-04-26 VITALS — BP 134/86 | HR 75 | Temp 98.8°F | Ht 65.0 in | Wt 177.0 lb

## 2018-04-26 DIAGNOSIS — I10 Essential (primary) hypertension: Secondary | ICD-10-CM

## 2018-04-26 DIAGNOSIS — Z23 Encounter for immunization: Secondary | ICD-10-CM | POA: Diagnosis not present

## 2018-04-26 DIAGNOSIS — F32A Depression, unspecified: Secondary | ICD-10-CM | POA: Insufficient documentation

## 2018-04-26 DIAGNOSIS — F329 Major depressive disorder, single episode, unspecified: Secondary | ICD-10-CM | POA: Insufficient documentation

## 2018-04-26 MED ORDER — AMLODIPINE BESYLATE 5 MG PO TABS: 5.0000 mg | ORAL_TABLET | Freq: Every day | ORAL | 3 refills | Status: DC

## 2018-04-26 NOTE — Patient Instructions (Signed)
Please let me know if you decide to do cologuard.  Continue current medications Maintain DASH diet. Encourage regular exercise (walking 30-36mins 3x/week).  May use benadryl 25-50mg  at bedtime or melatonin 10-15mg  at bedtime as needed for sleep.   DASH Eating Plan DASH stands for "Dietary Approaches to Stop Hypertension." The DASH eating plan is a healthy eating plan that has been shown to reduce high blood pressure (hypertension). It may also reduce your risk for type 2 diabetes, heart disease, and stroke. The DASH eating plan may also help with weight loss. What are tips for following this plan? General guidelines  Avoid eating more than 2,300 mg (milligrams) of salt (sodium) a day. If you have hypertension, you may need to reduce your sodium intake to 1,500 mg a day.  Limit alcohol intake to no more than 1 drink a day for nonpregnant women and 2 drinks a day for men. One drink equals 12 oz of beer, 5 oz of wine, or 1 oz of hard liquor.  Work with your health care provider to maintain a healthy body weight or to lose weight. Ask what an ideal weight is for you.  Get at least 30 minutes of exercise that causes your heart to beat faster (aerobic exercise) most days of the week. Activities may include walking, swimming, or biking.  Work with your health care provider or diet and nutrition specialist (dietitian) to adjust your eating plan to your individual calorie needs. Reading food labels  Check food labels for the amount of sodium per serving. Choose foods with less than 5 percent of the Daily Value of sodium. Generally, foods with less than 300 mg of sodium per serving fit into this eating plan.  To find whole grains, look for the word "whole" as the first word in the ingredient list. Shopping  Buy products labeled as "low-sodium" or "no salt added."  Buy fresh foods. Avoid canned foods and premade or frozen meals. Cooking  Avoid adding salt when cooking. Use salt-free  seasonings or herbs instead of table salt or sea salt. Check with your health care provider or pharmacist before using salt substitutes.  Do not fry foods. Cook foods using healthy methods such as baking, boiling, grilling, and broiling instead.  Cook with heart-healthy oils, such as olive, canola, soybean, or sunflower oil. Meal planning   Eat a balanced diet that includes: ? 5 or more servings of fruits and vegetables each day. At each meal, try to fill half of your plate with fruits and vegetables. ? Up to 6-8 servings of whole grains each day. ? Less than 6 oz of lean meat, poultry, or fish each day. A 3-oz serving of meat is about the same size as a deck of cards. One egg equals 1 oz. ? 2 servings of low-fat dairy each day. ? A serving of nuts, seeds, or beans 5 times each week. ? Heart-healthy fats. Healthy fats called Omega-3 fatty acids are found in foods such as flaxseeds and coldwater fish, like sardines, salmon, and mackerel.  Limit how much you eat of the following: ? Canned or prepackaged foods. ? Food that is high in trans fat, such as fried foods. ? Food that is high in saturated fat, such as fatty meat. ? Sweets, desserts, sugary drinks, and other foods with added sugar. ? Full-fat dairy products.  Do not salt foods before eating.  Try to eat at least 2 vegetarian meals each week.  Eat more home-cooked food and less restaurant, buffet, and  fast food.  When eating at a restaurant, ask that your food be prepared with less salt or no salt, if possible. What foods are recommended? The items listed may not be a complete list. Talk with your dietitian about what dietary choices are best for you. Grains Whole-grain or whole-wheat bread. Whole-grain or whole-wheat pasta. Brown rice. Orpah Cobb. Bulgur. Whole-grain and low-sodium cereals. Pita bread. Low-fat, low-sodium crackers. Whole-wheat flour tortillas. Vegetables Fresh or frozen vegetables (raw, steamed, roasted,  or grilled). Low-sodium or reduced-sodium tomato and vegetable juice. Low-sodium or reduced-sodium tomato sauce and tomato paste. Low-sodium or reduced-sodium canned vegetables. Fruits All fresh, dried, or frozen fruit. Canned fruit in natural juice (without added sugar). Meat and other protein foods Skinless chicken or [place]. Ground chicken or [place]. Pork with fat trimmed off. Fish and seafood. Egg whites. Dried beans, peas, or lentils. Unsalted nuts, nut butters, and seeds. Unsalted canned beans. Lean cuts of beef with fat trimmed off. Low-sodium, lean deli meat. Dairy Low-fat (1%) or fat-free (skim) milk. Fat-free, low-fat, or reduced-fat cheeses. Nonfat, low-sodium ricotta or cottage cheese. Low-fat or nonfat yogurt. Low-fat, low-sodium cheese. Fats and oils Soft margarine without trans fats. Vegetable oil. Low-fat, reduced-fat, or light mayonnaise and salad dressings (reduced-sodium). Canola, safflower, olive, soybean, and sunflower oils. Avocado. Seasoning and other foods Herbs. Spices. Seasoning mixes without salt. Unsalted popcorn and pretzels. Fat-free sweets. What foods are not recommended? The items listed may not be a complete list. Talk with your dietitian about what dietary choices are best for you. Grains Baked goods made with fat, such as croissants, muffins, or some breads. Dry pasta or rice meal packs. Vegetables Creamed or fried vegetables. Vegetables in a cheese sauce. Regular canned vegetables (not low-sodium or reduced-sodium). Regular canned tomato sauce and paste (not low-sodium or reduced-sodium). Regular tomato and vegetable juice (not low-sodium or reduced-sodium). Rosita Fire. Olives. Fruits Canned fruit in a light or heavy syrup. Fried fruit. Fruit in cream or butter sauce. Meat and other protein foods Fatty cuts of meat. Ribs. Fried meat. Tomasa Blase. Sausage. Bologna and other processed lunch meats. Salami. Fatback. Hotdogs. Bratwurst. Salted nuts and seeds. Canned beans  with added salt. Canned or smoked fish. Whole eggs or egg yolks. Chicken or [place] with skin. Dairy Whole or 2% milk, cream, and half-and-half. Whole or full-fat cream cheese. Whole-fat or sweetened yogurt. Full-fat cheese. Nondairy creamers. Whipped toppings. Processed cheese and cheese spreads. Fats and oils Butter. Stick margarine. Lard. Shortening. Ghee. Bacon fat. Tropical oils, such as coconut, palm kernel, or palm oil. Seasoning and other foods Salted popcorn and pretzels. Onion salt, garlic salt, seasoned salt, table salt, and sea salt. Worcestershire sauce. Tartar sauce. Barbecue sauce. Teriyaki sauce. Soy sauce, including reduced-sodium. Steak sauce. Canned and packaged gravies. Fish sauce. Oyster sauce. Cocktail sauce. Horseradish that you find on the shelf. Ketchup. Mustard. Meat flavorings and tenderizers. Bouillon cubes. Hot sauce and Tabasco sauce. Premade or packaged marinades. Premade or packaged taco seasonings. Relishes. Regular salad dressings. Where to find more information:  National Heart, Lung, and Blood Institute: PopSteam.is  American Heart Association: www.heart.org Summary  The DASH eating plan is a healthy eating plan that has been shown to reduce high blood pressure (hypertension). It may also reduce your risk for type 2 diabetes, heart disease, and stroke.  With the DASH eating plan, you should limit salt (sodium) intake to 2,300 mg a day. If you have hypertension, you may need to reduce your sodium intake to 1,500 mg a day.  When on the  DASH eating plan, aim to eat more fresh fruits and vegetables, whole grains, lean proteins, low-fat dairy, and heart-healthy fats.  Work with your health care provider or diet and nutrition specialist (dietitian) to adjust your eating plan to your individual calorie needs. This information is not intended to replace advice given to you by your health care provider. Make sure you discuss any questions you have with your health  care provider. Document Released: 06/03/2011 Document Revised: 06/07/2016 Document Reviewed: 06/07/2016 Elsevier Interactive Patient Education  [employer].

## 2018-04-26 NOTE — Progress Notes (Signed)
Subjective:  Patient ID: Brittney Hayes, female    DOB: 09-Dec-1965  Age: 52 y.o. MRN: [id number]  CC: Follow-up (follow up on BP/unable to stay at sleep at night/tdap?)  Insomnia  Primary symptoms: fragmented sleep, no sleep disturbance, no difficulty falling asleep, no somnolence, no frequent awakening, premature morning awakening, no malaise/fatigue, no napping.   The current episode started more than one year. The onset quality is gradual. The problem occurs intermittently. The problem is unchanged. The symptoms are aggravated by SSRI use. How many beverages per day that contain caffeine: 0 - 1.  Types of beverages you drink: coffee. Nothing relieves the symptoms. Treatments tried: melatonin 10mg . The treatment provided moderate relief. Typical bedtime:  8-10 P.M..  How long after going to bed to you fall asleep: 15-30 minutes.   PMH includes: hypertension, depression, no family stress or anxiety, no restless leg syndrome, work related stressors, no chronic pain, no apnea. Prior diagnostic workup includes:  Blood work.   HTN: Improving with amlodipine. Does not check BP at home. No change in diet, no exercise. BP Readings from Last 3 Encounters:  04/26/18 134/86  03/27/18 (!) 142/86  02/24/18 (!) 150/94   Reviewed past Medical, Social and Family history today.  Outpatient Medications Prior to Visit  Medication Sig Dispense Refill  . sertraline (ZOLOFT) 50 MG tablet Take 1 tablet (50 mg total) by mouth daily. 90 tablet 3  . amLODipine (NORVASC) 5 MG tablet Take 1 tablet (5 mg total) by mouth at bedtime. 30 tablet 5   No facility-administered medications prior to visit.     ROS See HPI  Objective:  BP 134/86   Pulse 75   Temp 98.8 F (37.1 C) (Oral)   Ht 5\' 5"  (1.651 m)   Wt 177 lb (80.3 kg)   SpO2 98%   BMI 29.45 kg/m   BP Readings from Last 3 Encounters:  04/26/18 134/86  03/27/18 (!) 142/86  02/24/18 (!) 150/94    Wt Readings from Last 3 Encounters:  04/26/18  177 lb (80.3 kg)  03/27/18 178 lb (80.7 kg)  02/24/18 177 lb (80.3 kg)    Physical Exam  Constitutional: She is oriented to person, place, and time. She appears well-developed and well-nourished.  Cardiovascular: Normal rate, regular rhythm, normal heart sounds and intact distal pulses.  Pulmonary/Chest: Effort normal and breath sounds normal.  Musculoskeletal: She exhibits no edema.  Neurological: She is alert and oriented to person, place, and time.  Psychiatric: She has a normal mood and affect. Her behavior is normal. Thought content normal.  Vitals reviewed.   Lab Results  Component Value Date   WBC 7.4 02/24/2018   HGB 15.1 (H) 02/24/2018   HCT 45.2 02/24/2018   PLT 207.0 02/24/2018   GLUCOSE 78 02/24/2018   CHOL 164 11/24/2017   TRIG 65 11/24/2017   HDL 59 11/24/2017   LDLCALC 92 11/24/2017   ALT 13 02/24/2018   AST 18 02/24/2018   NA 140 02/24/2018   K 3.6 02/24/2018   CL 103 02/24/2018   CREATININE 0.68 02/24/2018   BUN 12 02/24/2018   CO2 28 02/24/2018   TSH 1.33 02/24/2018   HGBA1C 5.1 11/24/2017    Mm 3d Screen Breast Bilateral  Result Date: 03/29/2018 CLINICAL DATA:  Screening. EXAM: DIGITAL SCREENING BILATERAL MAMMOGRAM WITH TOMO AND CAD COMPARISON:  Previous exam(s). ACR Breast Density Category b: There are scattered areas of fibroglandular density. FINDINGS: There are no findings suspicious for malignancy. Images were processed with CAD.  IMPRESSION: No mammographic evidence of malignancy. A result letter of this screening mammogram will be mailed directly to the patient. RECOMMENDATION: Screening mammogram in one year. (Code:SM-B-01Y) BI-RADS CATEGORY  1: Negative. Electronically Signed   By: Amie Portland M.D.   On: 03/29/2018 10:05    Assessment & Plan:   Brittney Hayes was seen today for follow-up.  Diagnoses and all orders for this visit:  Essential hypertension -     amLODipine (NORVASC) 5 MG tablet; Take 1 tablet (5 mg total) by mouth at bedtime.  Need  for diphtheria-tetanus-pertussis (Tdap) vaccine -     Tdap vaccine greater than or equal to 7yo IM   I am having Brittney Hayes maintain her sertraline and amLODipine.  Meds ordered this encounter  Medications  . amLODipine (NORVASC) 5 MG tablet    Sig: Take 1 tablet (5 mg total) by mouth at bedtime.    Dispense:  90 tablet    Refill:  3    Order Specific Question:   Supervising Provider    Answer:   Dianne Dun [[id number]]    Follow-up: Return in about 6 months (around 10/26/2018) for HTN .  [name], NP

## 2018-05-12 ENCOUNTER — Encounter: Payer: Self-pay | Admitting: Nurse Practitioner

## 2018-05-12 NOTE — Progress Notes (Signed)
Abstracted result and sent to scan  

## 2018-06-05 ENCOUNTER — Ambulatory Visit (INDEPENDENT_AMBULATORY_CARE_PROVIDER_SITE_OTHER): Payer: PRIVATE HEALTH INSURANCE | Admitting: Psychology

## 2018-06-05 DIAGNOSIS — F331 Major depressive disorder, recurrent, moderate: Secondary | ICD-10-CM | POA: Diagnosis not present

## 2018-06-06 ENCOUNTER — Telehealth: Payer: Self-pay | Admitting: Nurse Practitioner

## 2018-06-06 NOTE — Telephone Encounter (Signed)
Left vm for the pt to call back, need to inform her that we do not have record from Dr. Sheldon SilvanKillingsworth yet, I refax the release to their Medical records department at [phone] they in the recording it can take up to 30 days. Waiting for their med record to call back if they received the first request we fax on 05/05/18.    Copied from CRM [id number]. Topic: General - Other >> Jun 05, 2018  4:29 PM Terisa Starraylor, Brittany L wrote: Reason for CRM: Patient called and stated that she came into the office a few weeks ago and signed a release of medical records form to get records from her previous doctor in [place]. She said she had a colonoscopy in 2017 and said she was good to not have one for 10 years. She said that her employer is going to penalize her if there is no documentation of some sort stating she does not have to have one before ten years. Patient said that her phone is broken and can only be reached at work [phone]

## 2018-06-07 ENCOUNTER — Encounter: Payer: Self-pay | Admitting: Nurse Practitioner

## 2018-06-07 NOTE — Telephone Encounter (Signed)
Left vm for the pt to call back, letter is done, need to know if pt wants to pick it up or she can print it out from my chart.

## 2018-06-07 NOTE — Telephone Encounter (Signed)
Record received and place on Charlotte's desk for review and note.

## 2018-06-07 NOTE — Progress Notes (Signed)
Abstracted result and sent to scan  

## 2018-06-08 NOTE — Telephone Encounter (Signed)
Pt is aware. She will print out the letter from my chart.

## 2018-07-04 ENCOUNTER — Ambulatory Visit (INDEPENDENT_AMBULATORY_CARE_PROVIDER_SITE_OTHER): Payer: PRIVATE HEALTH INSURANCE | Admitting: Psychology

## 2018-07-04 DIAGNOSIS — F411 Generalized anxiety disorder: Secondary | ICD-10-CM | POA: Diagnosis not present

## 2018-07-04 DIAGNOSIS — F3342 Major depressive disorder, recurrent, in full remission: Secondary | ICD-10-CM

## 2018-07-04 DIAGNOSIS — F909 Attention-deficit hyperactivity disorder, unspecified type: Secondary | ICD-10-CM

## 2018-07-06 ENCOUNTER — Ambulatory Visit (INDEPENDENT_AMBULATORY_CARE_PROVIDER_SITE_OTHER): Payer: PRIVATE HEALTH INSURANCE | Admitting: Psychology

## 2018-07-06 ENCOUNTER — Ambulatory Visit: Payer: PRIVATE HEALTH INSURANCE | Admitting: Psychology

## 2018-07-06 DIAGNOSIS — F331 Major depressive disorder, recurrent, moderate: Secondary | ICD-10-CM

## 2018-07-20 ENCOUNTER — Ambulatory Visit (INDEPENDENT_AMBULATORY_CARE_PROVIDER_SITE_OTHER): Payer: PRIVATE HEALTH INSURANCE | Admitting: Psychology

## 2018-07-20 DIAGNOSIS — F331 Major depressive disorder, recurrent, moderate: Secondary | ICD-10-CM

## 2018-07-27 ENCOUNTER — Ambulatory Visit: Payer: PRIVATE HEALTH INSURANCE | Admitting: Psychology

## 2018-07-27 ENCOUNTER — Ambulatory Visit (INDEPENDENT_AMBULATORY_CARE_PROVIDER_SITE_OTHER): Payer: PRIVATE HEALTH INSURANCE | Admitting: Psychology

## 2018-07-27 DIAGNOSIS — F411 Generalized anxiety disorder: Secondary | ICD-10-CM

## 2018-08-03 ENCOUNTER — Ambulatory Visit: Payer: PRIVATE HEALTH INSURANCE | Admitting: Psychology

## 2018-08-28 ENCOUNTER — Ambulatory Visit (INDEPENDENT_AMBULATORY_CARE_PROVIDER_SITE_OTHER): Payer: PRIVATE HEALTH INSURANCE | Admitting: Psychology

## 2018-08-28 DIAGNOSIS — F331 Major depressive disorder, recurrent, moderate: Secondary | ICD-10-CM | POA: Diagnosis not present

## 2018-10-25 ENCOUNTER — Encounter: Payer: Self-pay | Admitting: Nurse Practitioner

## 2018-10-26 ENCOUNTER — Ambulatory Visit (INDEPENDENT_AMBULATORY_CARE_PROVIDER_SITE_OTHER): Payer: PRIVATE HEALTH INSURANCE | Admitting: Nurse Practitioner

## 2018-10-26 ENCOUNTER — Encounter: Payer: Self-pay | Admitting: Nurse Practitioner

## 2018-10-26 VITALS — BP 145/91 | Ht 65.0 in

## 2018-10-26 DIAGNOSIS — F419 Anxiety disorder, unspecified: Secondary | ICD-10-CM

## 2018-10-26 DIAGNOSIS — I10 Essential (primary) hypertension: Secondary | ICD-10-CM | POA: Diagnosis not present

## 2018-10-26 MED ORDER — SERTRALINE HCL 50 MG PO TABS: 50.0000 mg | ORAL_TABLET | Freq: Every day | ORAL | 3 refills | Status: AC

## 2018-10-26 MED ORDER — BENAZEPRIL HCL 5 MG PO TABS: 5.0000 mg | ORAL_TABLET | Freq: Every day | ORAL | 2 refills | Status: DC

## 2018-10-26 MED ORDER — AMLODIPINE BESYLATE 5 MG PO TABS: 5.0000 mg | ORAL_TABLET | Freq: Every day | ORAL | 1 refills | Status: DC

## 2018-10-26 NOTE — Progress Notes (Signed)
Virtual Visit via Video Note  I connected with Brittney Hayes on 10/26/18 at  8:15 AM EDT by a video enabled telemedicine application and verified that I am speaking with the correct person using two identifiers.  Location: Patient: Home Provider: Office   I discussed the limitations of evaluation and management by telemedicine and the availability of in person appointments. The patient expressed understanding and agreed to proceed.  CC: follow up BP--BP reading sent to mychart last night: 4/24--140/91, 4/25--149/89, 4/26--156/82,4/28--140/77, 4/29--153/85  History of Present Illness:  HTN: BP not at goal with amlodipine. BP Readings from Last 3 Encounters:  10/26/18 (!) 145/91  04/26/18 134/86  03/27/18 (!) 142/86   Anxiety and Depression: Stable mood with zoloft. Depression screen [facility] 2/9 10/26/2018 02/24/2018 02/24/2018  Decreased Interest 0 1 3  Down, Depressed, Hopeless 0 1 3  PHQ - 2 Score 0 2 6  Altered sleeping 1 1 -  Tired, decreased energy 0 1 -  Change in appetite 1 0 -  Feeling bad or failure about yourself  0 1 -  Trouble concentrating 1 3 -  Moving slowly or fidgety/restless 0 0 -  Suicidal thoughts 0 0 -  PHQ-9 Score 3 8 -   GAD 7 : Generalized Anxiety Score 10/26/2018 02/24/2018  Nervous, Anxious, on Edge 1 1  Control/stop worrying 1 1  Worry too much - different things 1 1  Trouble relaxing 1 1  Restless 1 1  Easily annoyed or irritable 1 1  Afraid - awful might happen 0 1  Total GAD 7 Score 6 7   Review of Systems  Constitutional: Negative.   Eyes: Negative.   Respiratory: Negative.   Cardiovascular: Negative.   Neurological: Negative.   Psychiatric/Behavioral: Negative for hallucinations, memory loss, substance abuse and suicidal ideas. The patient does not have insomnia.    Observations/Objective: Physical Exam  Constitutional: She is oriented to person, place, and time.  Pulmonary/Chest: Effort normal.  Musculoskeletal:        General: No  edema.  Neurological: She is alert and oriented to person, place, and time.  Psychiatric: She has a normal mood and affect. Her behavior is normal. Thought content normal.  Vitals reviewed.  Assessment and Plan: [name] was seen today for follow-up.  Diagnoses and all orders for this visit:  Essential hypertension -     benazepril (LOTENSIN) 5 MG tablet; Take 1 tablet (5 mg total) by mouth daily. -     amLODipine (NORVASC) 5 MG tablet; Take 1 tablet (5 mg total) by mouth at bedtime.  Elevated BP without diagnosis of hypertension  Anxiety -     sertraline (ZOLOFT) 50 MG tablet; Take 1 tablet (50 mg total) by mouth daily.   Follow Up Instructions: Continue amlodipine and zoloft. Add benazepril for HTN F/up in 48months   I discussed the assessment and treatment plan with the patient. The patient was provided an opportunity to ask questions and all were answered. The patient agreed with the plan and demonstrated an understanding of the instructions.   The patient was advised to call back or seek an in-person evaluation if the symptoms worsen or if the condition fails to improve as anticipated.   Brittney Penna, NP

## 2018-10-26 NOTE — Patient Instructions (Signed)
Continue amlodipine and zoloft. Add benazepril for HTN F/up in 4months

## 2018-11-04 ENCOUNTER — Encounter: Payer: Self-pay | Admitting: Nurse Practitioner

## 2018-12-04 ENCOUNTER — Telehealth: Payer: Self-pay

## 2018-12-04 NOTE — Telephone Encounter (Signed)

## 2018-12-05 ENCOUNTER — Ambulatory Visit (INDEPENDENT_AMBULATORY_CARE_PROVIDER_SITE_OTHER): Payer: PRIVATE HEALTH INSURANCE | Admitting: Nurse Practitioner

## 2018-12-05 ENCOUNTER — Encounter: Payer: Self-pay | Admitting: Nurse Practitioner

## 2018-12-05 VITALS — BP 122/80 | HR 73 | Temp 98.5°F | Ht 65.0 in | Wt 196.0 lb

## 2018-12-05 DIAGNOSIS — I10 Essential (primary) hypertension: Secondary | ICD-10-CM | POA: Diagnosis not present

## 2018-12-05 LAB — BASIC METABOLIC PANEL
BUN: 9 mg/dL (ref 6–23)
CO2: 26 mEq/L (ref 19–32)
Calcium: 9.3 mg/dL (ref 8.4–10.5)
Chloride: 106 mEq/L (ref 96–112)
Creatinine, Ser: 0.6 mg/dL (ref 0.40–1.20)
GFR: 104.4 mL/min (ref >60.00)
Glucose, Bld: 78 mg/dL (ref 70–99)
Potassium: 3.9 mEq/L (ref 3.5–5.1)
Sodium: 140 mEq/L (ref 135–145)

## 2018-12-05 MED ORDER — AMLODIPINE BESYLATE 5 MG PO TABS: 5.0000 mg | ORAL_TABLET | Freq: Every day | ORAL | 1 refills | Status: AC

## 2018-12-05 MED ORDER — BENAZEPRIL HCL 5 MG PO TABS: 5.0000 mg | ORAL_TABLET | Freq: Every day | ORAL | 1 refills | Status: AC

## 2018-12-05 NOTE — Patient Instructions (Addendum)
Maintain current medications. F/up in 59months for CPE

## 2018-12-05 NOTE — Assessment & Plan Note (Signed)
Controlled with amlodipine and benazepril. BP Readings from Last 3 Encounters:  12/05/18 122/80  10/26/18 (!) 145/91  04/26/18 134/86   Repeat BMP today Maintain current medications

## 2018-12-05 NOTE — Progress Notes (Signed)
Subjective:  Patient ID: Brittney Hayes, female    DOB: [birth year]  Age: 53 y.o. MRN: [id number]  CC: Follow-up (follow up on BP--has BP reading from last month. not fasting. )  HPI  HTN: Controlled with amlodipine and benazepril. No cough, no edema, no headache, no dizziness. BP Readings from Last 3 Encounters:  12/05/18 122/80  10/26/18 (!) 145/91  04/26/18 134/86   Reviewed past Medical, Social and Family history today.  Outpatient Medications Prior to Visit  Medication Sig Dispense Refill  . sertraline (ZOLOFT) 50 MG tablet Take 1 tablet (50 mg total) by mouth daily. 90 tablet 3  . amLODipine (NORVASC) 5 MG tablet Take 1 tablet (5 mg total) by mouth at bedtime. 90 tablet 1  . benazepril (LOTENSIN) 5 MG tablet Take 1 tablet (5 mg total) by mouth daily. 30 tablet 2   No facility-administered medications prior to visit.     ROS See HPI  Objective:  BP 122/80   Pulse 73   Temp 98.5 F (36.9 C) (Oral)   Ht 5\' 5"  (1.651 m)   Wt 196 lb (88.9 kg)   SpO2 98%   BMI 32.62 kg/m   BP Readings from Last 3 Encounters:  12/05/18 122/80  10/26/18 (!) 145/91  04/26/18 134/86    Wt Readings from Last 3 Encounters:  12/05/18 196 lb (88.9 kg)  04/26/18 177 lb (80.3 kg)  03/27/18 178 lb (80.7 kg)    Physical Exam Vitals signs reviewed.  Eyes:     Extraocular Movements: Extraocular movements intact.     Conjunctiva/sclera: Conjunctivae normal.  Cardiovascular:     Rate and Rhythm: Normal rate and regular rhythm.  Pulmonary:     Effort: Pulmonary effort is normal.     Breath sounds: Normal breath sounds.  Musculoskeletal:     Right lower leg: No edema.     Left lower leg: No edema.  Neurological:     Mental Status: She is alert and oriented to person, place, and time.  Psychiatric:        Mood and Affect: Mood normal.        Behavior: Behavior normal.        Thought Content: Thought content normal.    Lab Results  Component Value Date   WBC 7.4 02/24/2018   HGB 15.1 (H) 02/24/2018   HCT 45.2 02/24/2018   PLT 207.0 02/24/2018   GLUCOSE 78 12/05/2018   CHOL 164 11/24/2017   TRIG 65 11/24/2017   HDL 59 11/24/2017   LDLCALC 92 11/24/2017   ALT 13 02/24/2018   AST 18 02/24/2018   NA 140 12/05/2018   K 3.9 12/05/2018   CL 106 12/05/2018   CREATININE 0.60 12/05/2018   BUN 9 12/05/2018   CO2 26 12/05/2018   TSH 1.33 02/24/2018   HGBA1C 5.1 11/24/2017    Mm 3d Screen Breast Bilateral  Result Date: 03/29/2018 CLINICAL DATA:  Screening. EXAM: DIGITAL SCREENING BILATERAL MAMMOGRAM WITH TOMO AND CAD COMPARISON:  Previous exam(s). ACR Breast Density Category b: There are scattered areas of fibroglandular density. FINDINGS: There are no findings suspicious for malignancy. Images were processed with CAD. IMPRESSION: No mammographic evidence of malignancy. A result letter of this screening mammogram will be mailed directly to the patient. RECOMMENDATION: Screening mammogram in one year. (Code:SM-B-01Y) BI-RADS CATEGORY  1: Negative. Electronically Signed   By: Amie Portlandavid  Ormond M.D.   On: 03/29/2018 10:05    Assessment & Plan:   Corrie DandyMary was seen today for follow-up.  Diagnoses and all orders for this visit:  Essential hypertension -     Basic metabolic panel -     benazepril (LOTENSIN) 5 MG tablet; Take 1 tablet (5 mg total) by mouth daily. -     amLODipine (NORVASC) 5 MG tablet; Take 1 tablet (5 mg total) by mouth at bedtime.   I am having Derald Macleod maintain her sertraline, benazepril, and amLODipine.  Meds ordered this encounter  Medications  . benazepril (LOTENSIN) 5 MG tablet    Sig: Take 1 tablet (5 mg total) by mouth daily.    Dispense:  90 tablet    Refill:  1    Order Specific Question:   Supervising Provider    Answer:   Lucille Passy [[id number]]  . amLODipine (NORVASC) 5 MG tablet    Sig: Take 1 tablet (5 mg total) by mouth at bedtime.    Dispense:  90 tablet    Refill:  1    Order Specific Question:   Supervising Provider     Answer:   Lucille Passy [[id number]]    Problem List Items Addressed This Visit      Cardiovascular and Mediastinum   Essential hypertension - Primary    Controlled with amlodipine and benazepril. BP Readings from Last 3 Encounters:  12/05/18 122/80  10/26/18 (!) 145/91  04/26/18 134/86   Repeat BMP today Maintain current medications      Relevant Medications   benazepril (LOTENSIN) 5 MG tablet   amLODipine (NORVASC) 5 MG tablet   Other Relevant Orders   Basic metabolic panel (Completed)      Follow-up: Return in about 3 months (around 03/07/2019) for CPE (fasting-F2F).  Wilfred Lacy, NP

## 2018-12-25 ENCOUNTER — Ambulatory Visit: Payer: PRIVATE HEALTH INSURANCE | Admitting: Nurse Practitioner

## 2019-03-12 ENCOUNTER — Ambulatory Visit: Payer: PRIVATE HEALTH INSURANCE | Admitting: Nurse Practitioner

## 2019-10-02 IMAGING — MG DIGITAL SCREENING BILATERAL MAMMOGRAM WITH TOMO AND CAD
8 series · 8 of 24 positions shown · non-contrast
Comparison: Previous exam(s).

CLINICAL DATA: Screening.

EXAM:
DIGITAL SCREENING BILATERAL MAMMOGRAM WITH TOMO AND CAD

[R MLO synth-2D]
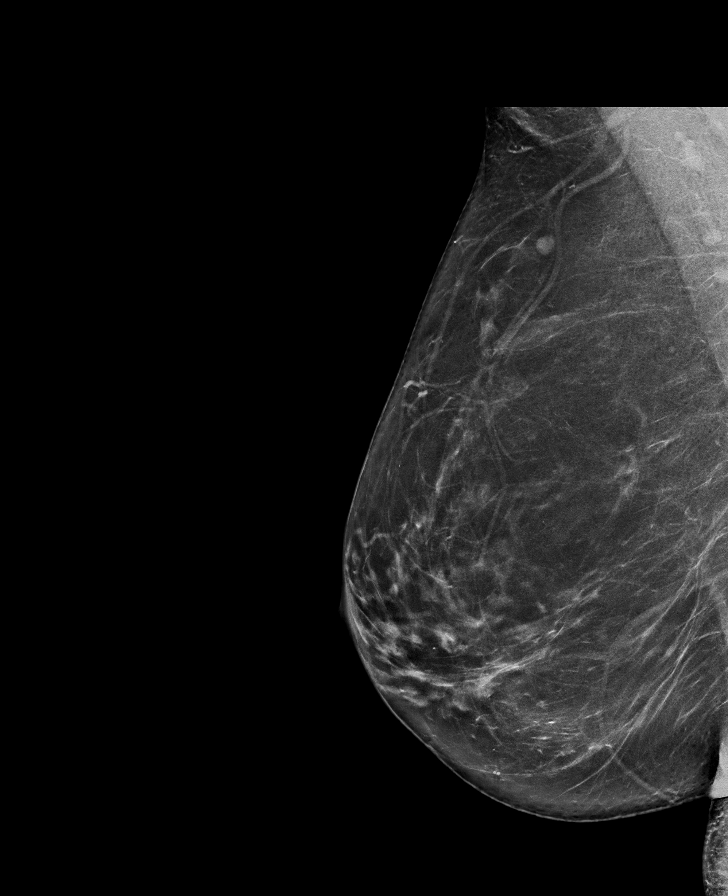

[L MLO synth-2D]
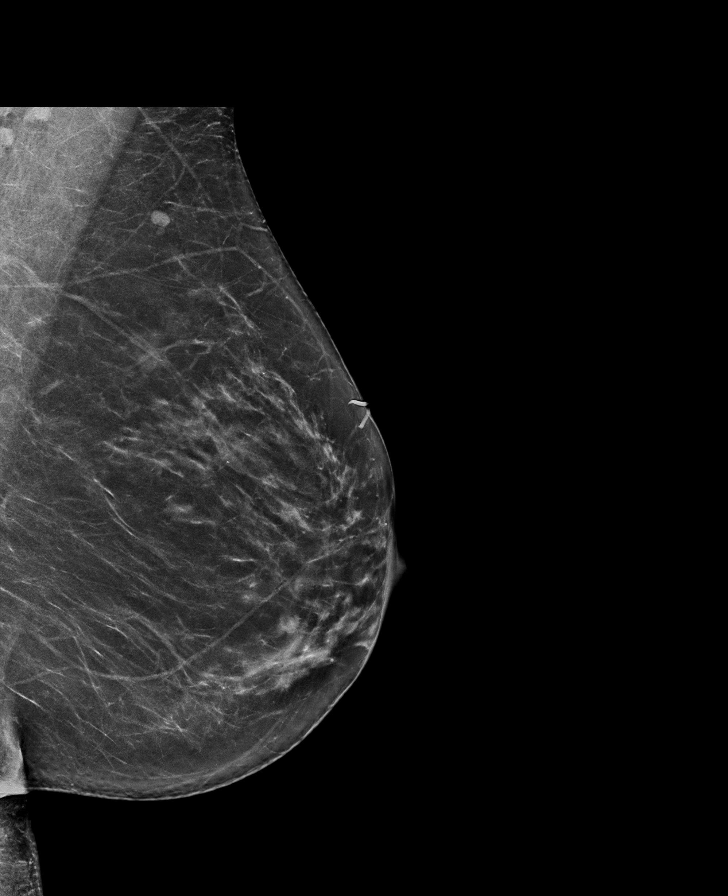

[L CC synth-2D]
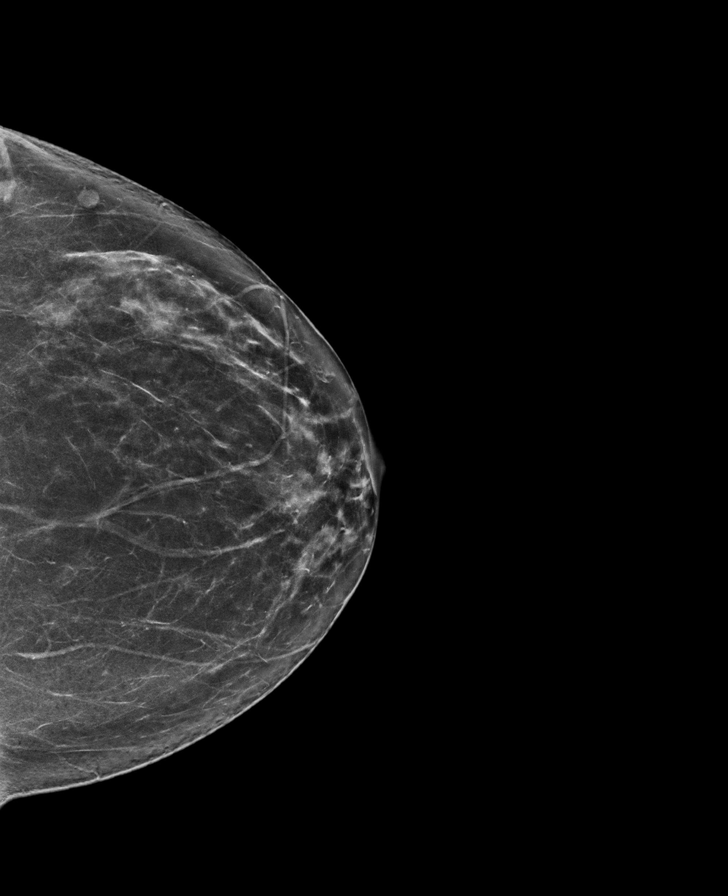

[R CC synth-2D]
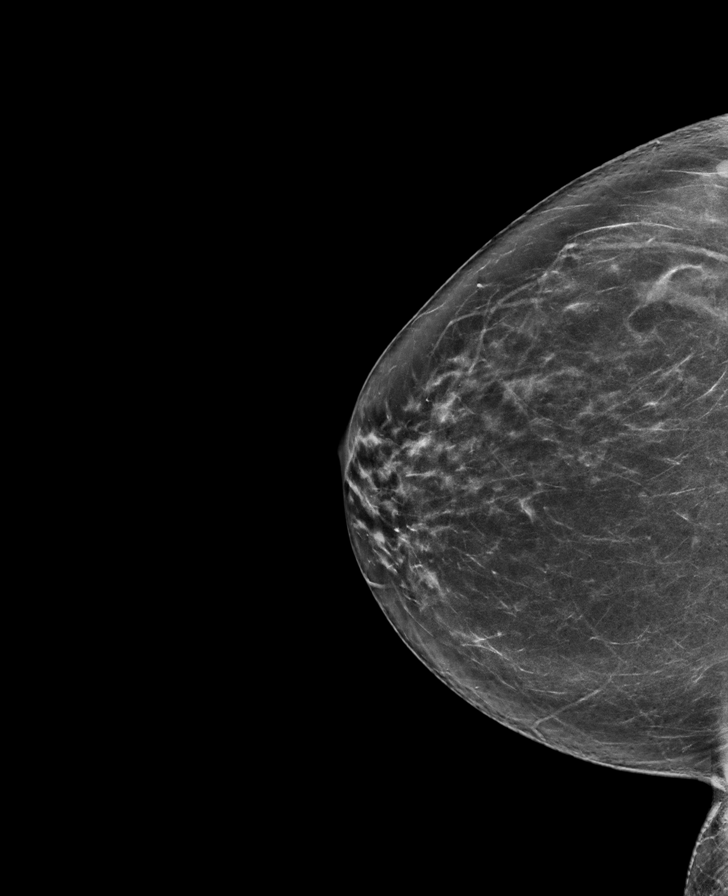

[R CC tomo · tomo slice 38/75.0]
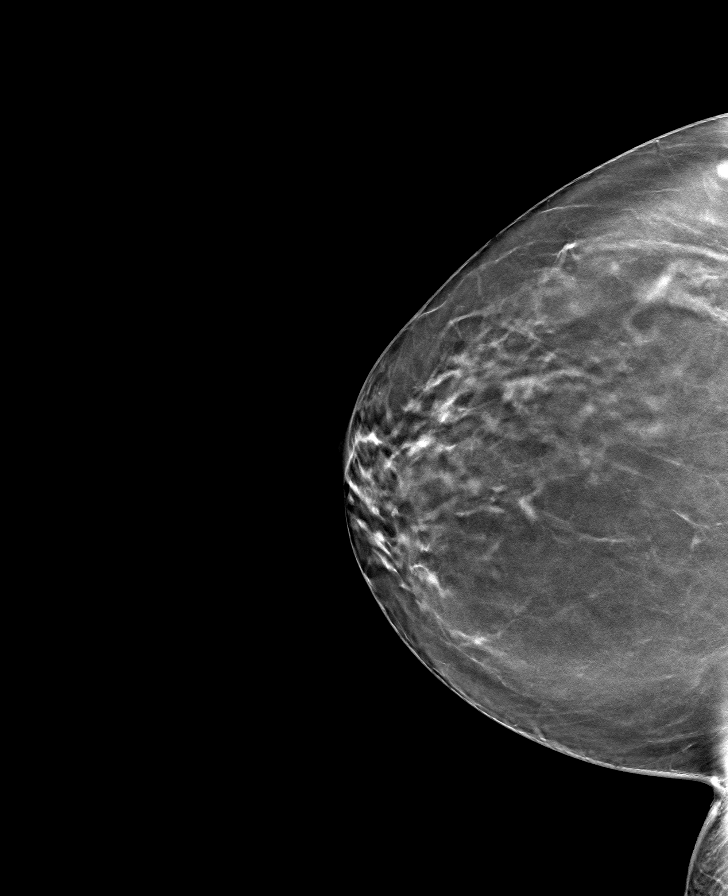

[L MLO tomo · tomo slice 37/74.0]
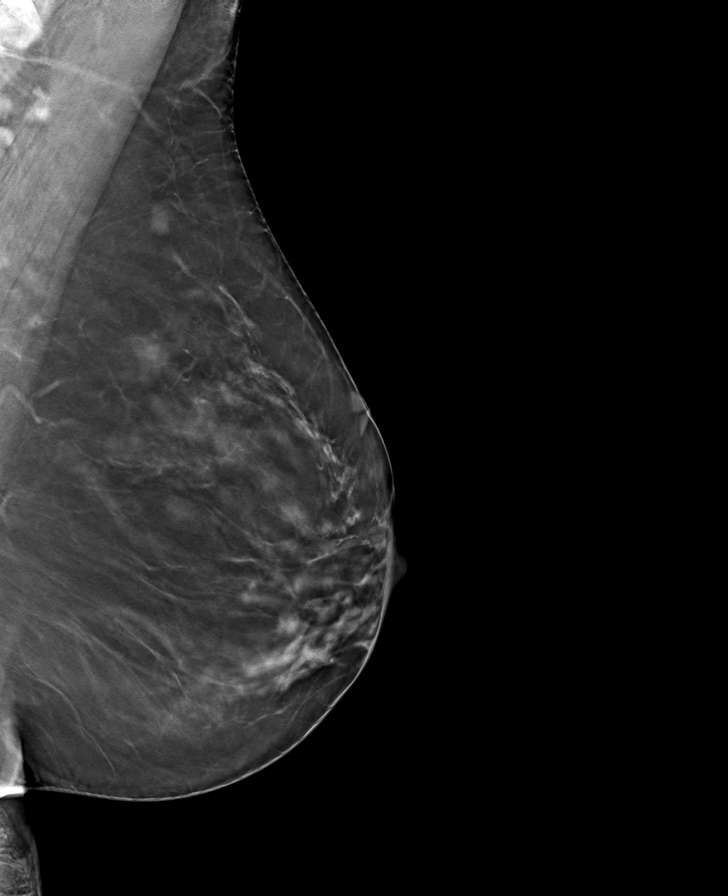

[R MLO tomo · tomo slice 41/82.0]
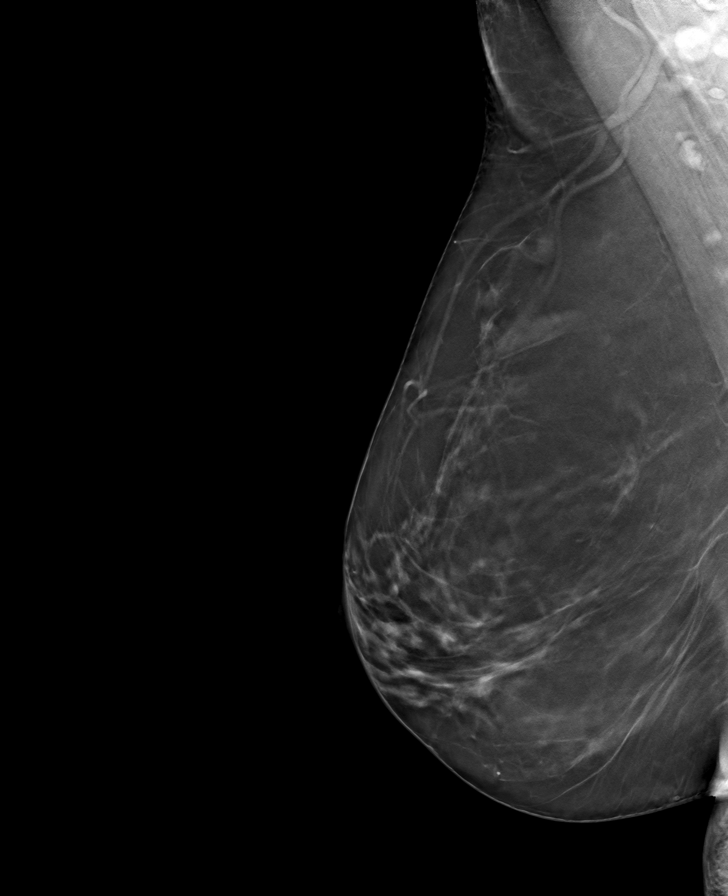

[L CC tomo · tomo slice 37/72.0]
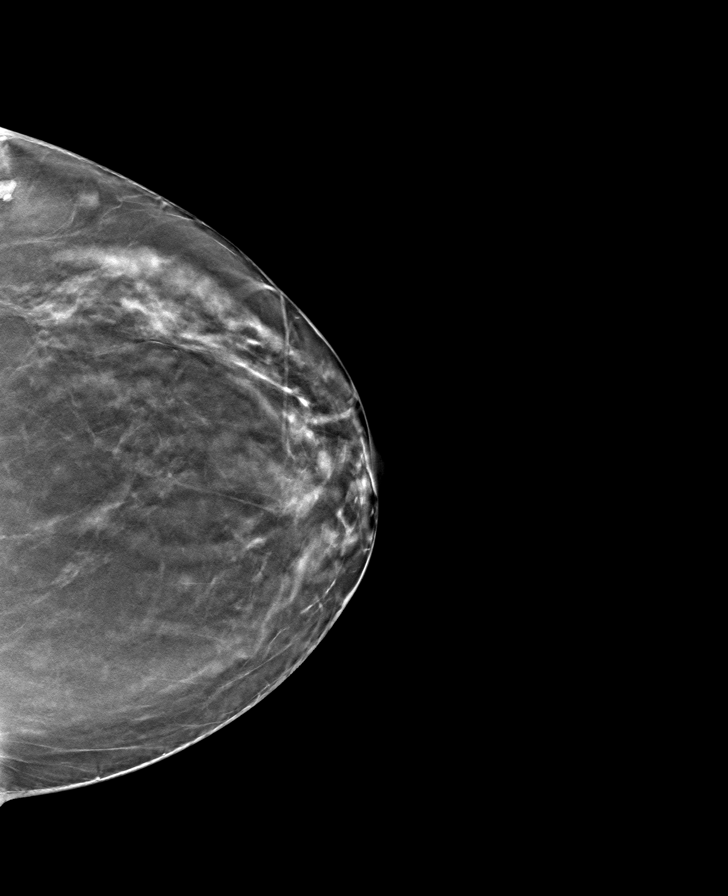

[8 of 24 positions shown; findings below may reference images not displayed]

ACR Breast Density Category b: There are scattered areas of
fibroglandular density.
FINDINGS: There are no findings suspicious for malignancy. Images were
processed with CAD.
IMPRESSION: No mammographic evidence of malignancy. A result letter of this
screening mammogram will be mailed directly to the patient.

RECOMMENDATION:
Screening mammogram in one year. (Code:CN-U-775)

BI-RADS CATEGORY  1: Negative.
